# Patient Record
Sex: Male | Born: 2000 | Race: White | Hispanic: No | Marital: Single | Smoking: Never smoker
Health system: Southern US, Community
[De-identification: ages and names within clinical notes are randomized; demographics above are authoritative.]

## PROBLEM LIST (undated history)

## (undated) HISTORY — PX: TYMPANOSTOMY TUBE PLACEMENT: SHX32

---
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
## 2020-01-24 ENCOUNTER — Emergency Department: Payer: BC Managed Care – PPO

## 2020-01-24 ENCOUNTER — Encounter: Payer: Self-pay | Admitting: Emergency Medicine

## 2020-01-24 ENCOUNTER — Emergency Department
Admission: EM | Admit: 2020-01-24 | Discharge: 2020-01-24 | Disposition: A | Discharge status: Home / Self Care | Payer: BC Managed Care – PPO | Attending: Student in an Organized Health Care Education/Training Program | Admitting: Student in an Organized Health Care Education/Training Program

## 2020-01-24 ENCOUNTER — Other Ambulatory Visit: Payer: Self-pay

## 2020-01-24 DIAGNOSIS — Y9372 Activity, wrestling: Secondary | ICD-10-CM | POA: Insufficient documentation

## 2020-01-24 DIAGNOSIS — S0990XA Unspecified injury of head, initial encounter: Secondary | ICD-10-CM | POA: Diagnosis present

## 2020-01-24 DIAGNOSIS — Y999 Unspecified external cause status: Secondary | ICD-10-CM | POA: Insufficient documentation

## 2020-01-24 DIAGNOSIS — Y929 Unspecified place or not applicable: Secondary | ICD-10-CM | POA: Diagnosis not present

## 2020-01-24 DIAGNOSIS — W0110XA Fall on same level from slipping, tripping and stumbling with subsequent striking against unspecified object, initial encounter: Secondary | ICD-10-CM | POA: Diagnosis not present
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
## 2020-01-24 NOTE — ED Notes (Signed)
E-signature not working at this time. Pt verbalized understanding of D/C instructions, prescriptions and follow up care with no further questions at this time. Pt in NAD and ambulatory at time of D/C.  

## 2020-01-24 NOTE — ED Triage Notes (Signed)
See first RN Note; pt admits to ETOH use last night, pt A&O x4, ambulatory.
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
## 2020-09-16 IMAGING — CT CT HEAD W/O CM
4 series · 16 of 47 positions shown, 18 images · non-contrast
Comparison: None.

CLINICAL DATA: 19-year-old male with headache. Normal neurological
exam.

EXAM:
CT HEAD WITHOUT CONTRAST
TECHNIQUE: Contiguous axial images were obtained from the base of the skull
through the vertex without intravenous contrast.

[Series 2: head wo · axial · 0.43mm/px · z∈[+1094,+1204]mm · 7 of 30 slices shown, 9 images]
[im 4/30  brain]
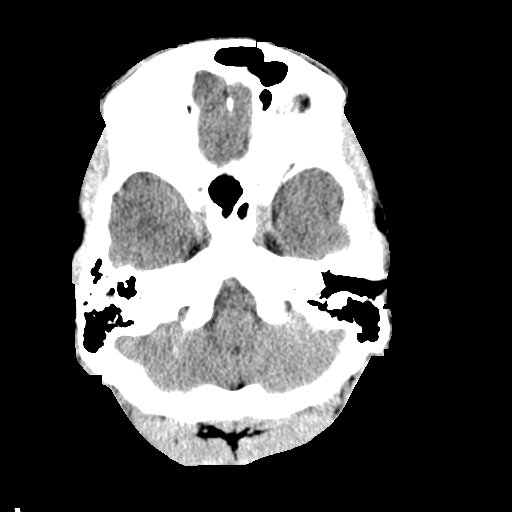
[im 4/30  bone]
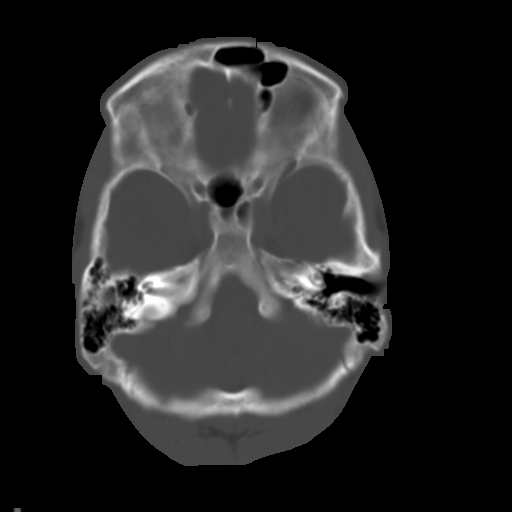
[im 8/30  brain]
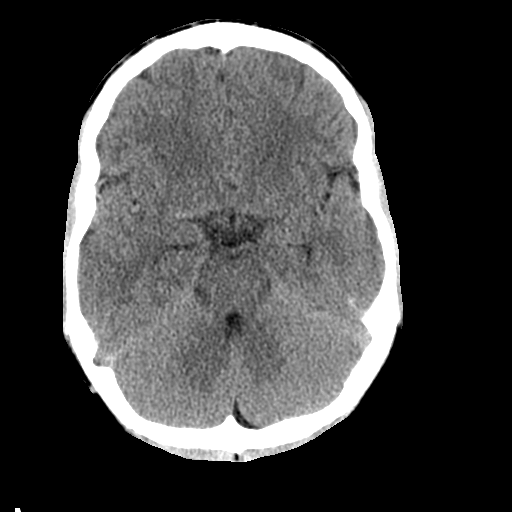
[im 11/30  brain]
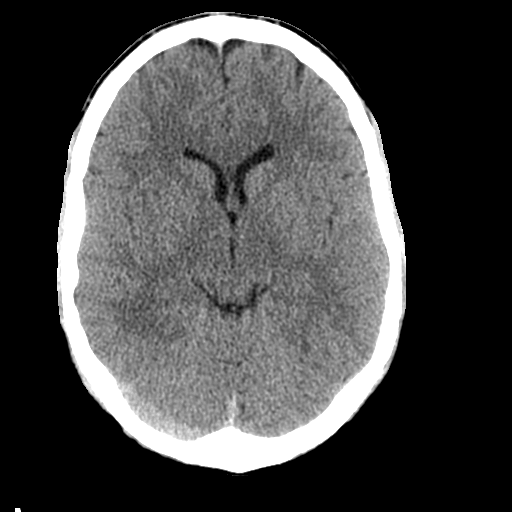
[im 15/30  brain]
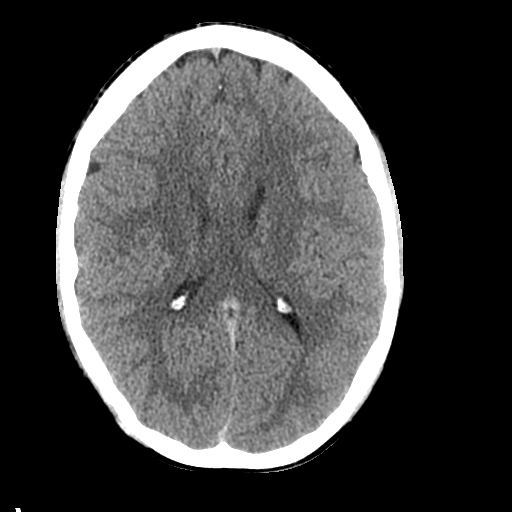
[im 19/30  brain]
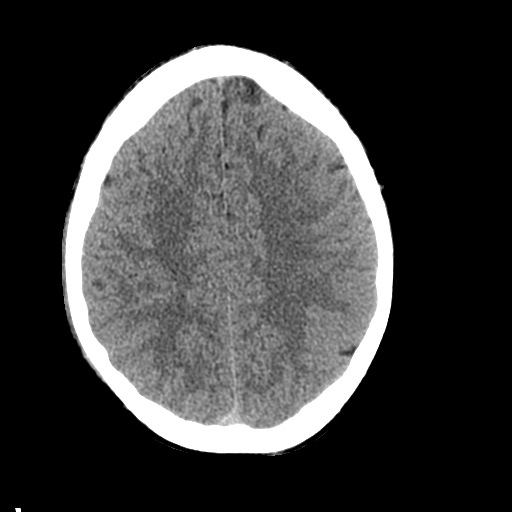
[im 19/30  bone]
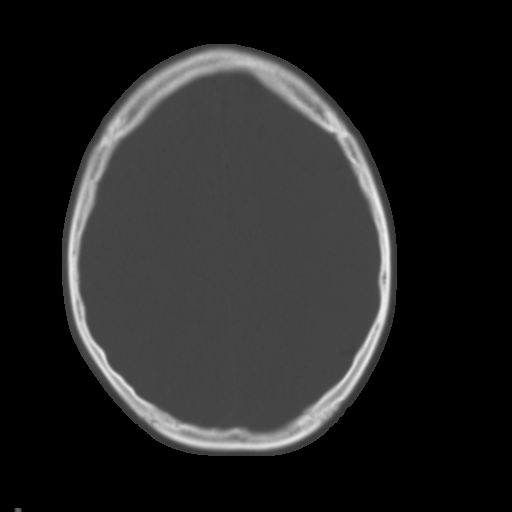
[im 22/30  brain]
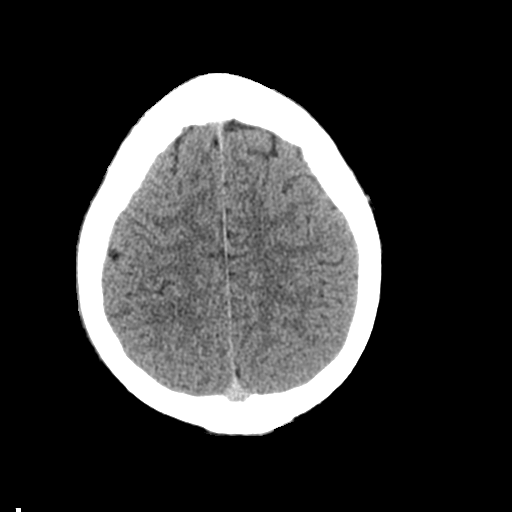
[im 26/30  brain]
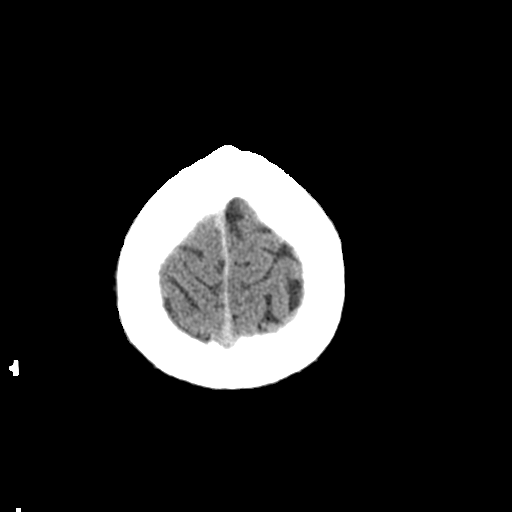

[Series 3: head bone · axial · 0.43mm/px · z∈[+1093,+1123]mm · 3 of 75 slices shown]
[im 8/75  bone]
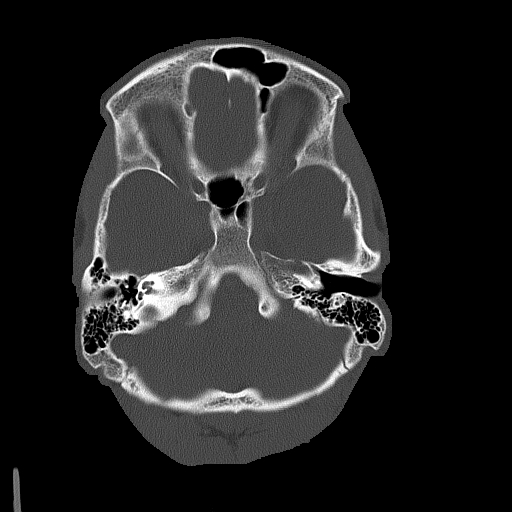
[im 15/75  bone]
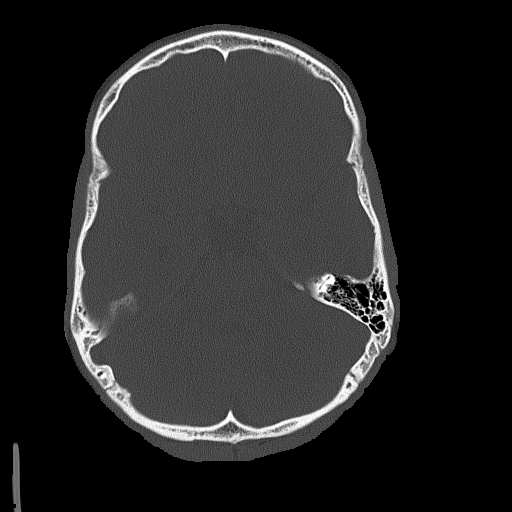
[im 23/75  bone]
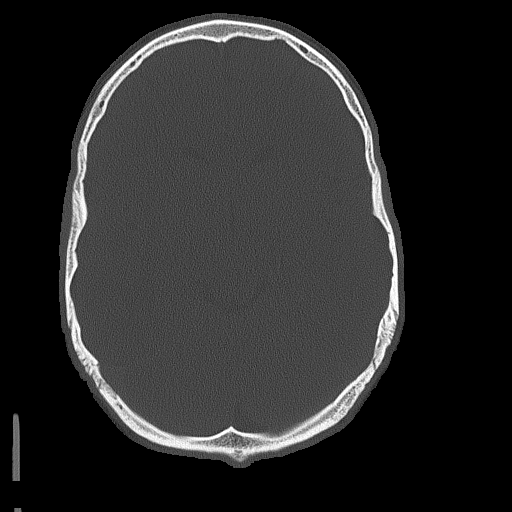

[Series 4: coronal soft tissue · coronal · 0.31mm/px · 3 of 67 slices shown]
[im 23/67  brain]
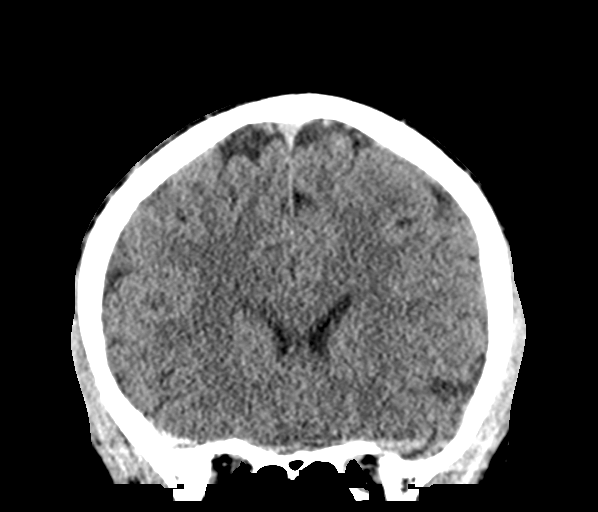
[im 30/67  brain]
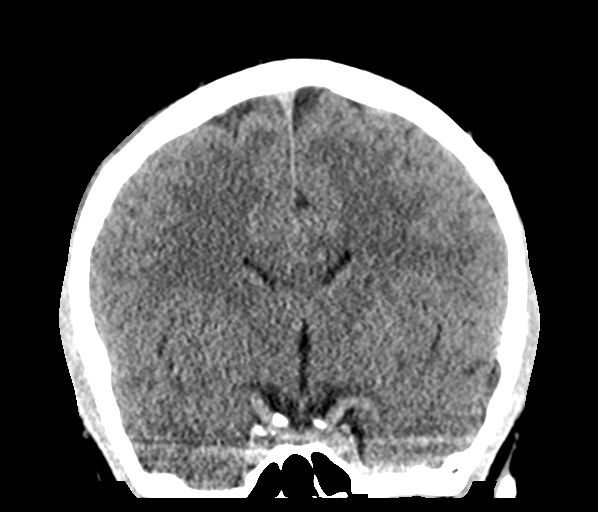
[im 37/67  brain]
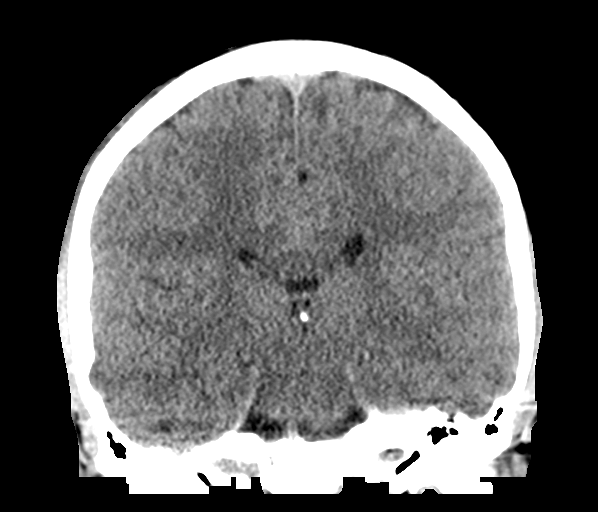

[Series 5: sagittal soft tissue · sagittal · 0.32mm/px · 3 of 55 slices shown]
[im 19/55  brain]
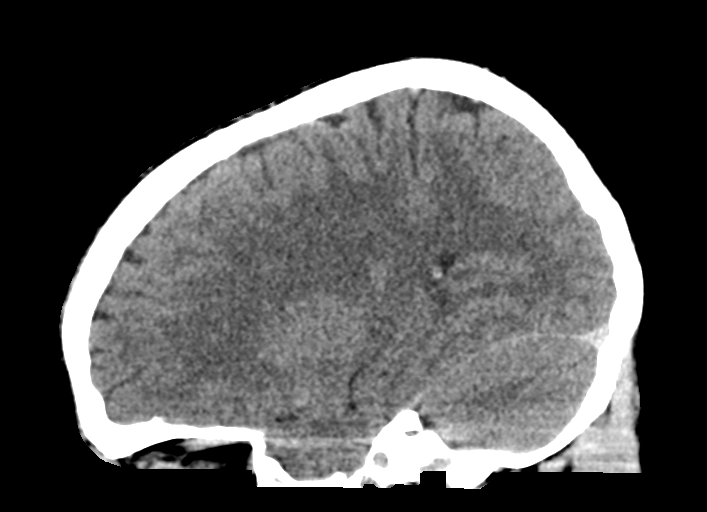
[im 28/55  brain]
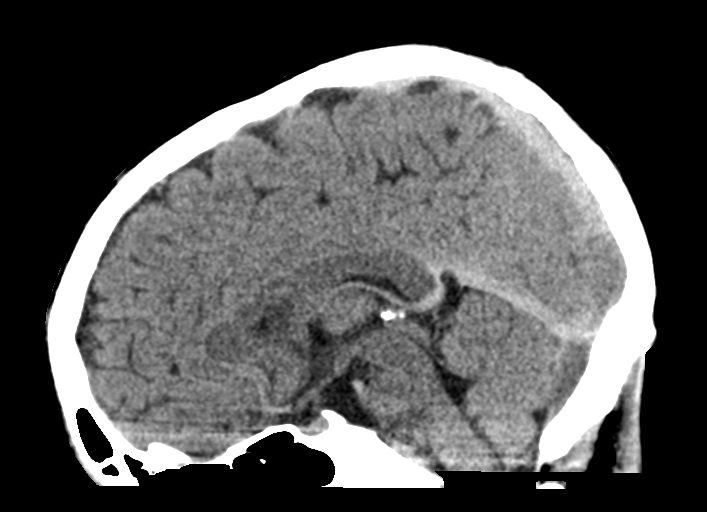
[im 37/55  brain]
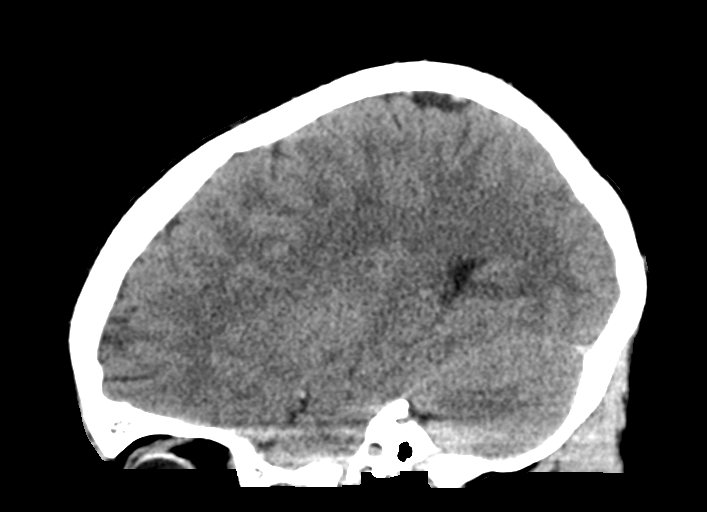

[16 of 47 positions shown; findings below may reference images not displayed]

FINDINGS: Brain: No evidence of acute infarction, hemorrhage, hydrocephalus,
extra-axial collection or mass lesion/mass effect.

Vascular: No hyperdense vessel or unexpected calcification.

Skull: Normal. Negative for fracture or focal lesion.

Sinuses/Orbits: No acute finding.

Other: None
IMPRESSION: Normal noncontrast CT of the brain.
